# Patient Record
Sex: Male | Born: 2000 | Race: Black or African American | Hispanic: No | Marital: Single | Smoking: Never smoker
Health system: Southern US, Community
[De-identification: ages and names within clinical notes are randomized; demographics above are authoritative.]

## PROBLEM LIST (undated history)

## (undated) DIAGNOSIS — F909 Attention-deficit hyperactivity disorder, unspecified type: Secondary | ICD-10-CM

## (undated) DIAGNOSIS — J302 Other seasonal allergic rhinitis: Secondary | ICD-10-CM

## (undated) DIAGNOSIS — Z789 Other specified health status: Secondary | ICD-10-CM

## (undated) HISTORY — DX: Other specified health status: Z78.9

---
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
## 2018-01-15 IMAGING — DX DG HAND COMPLETE 3+V*L*
3 series · 3 of 3 positions shown · non-contrast
Comparison: None.

CLINICAL DATA: Closed hand in metal door, with left middle and ring
finger pain and bruising. Initial encounter.

EXAM:
LEFT HAND - COMPLETE 3+ VIEW

[hand pa]
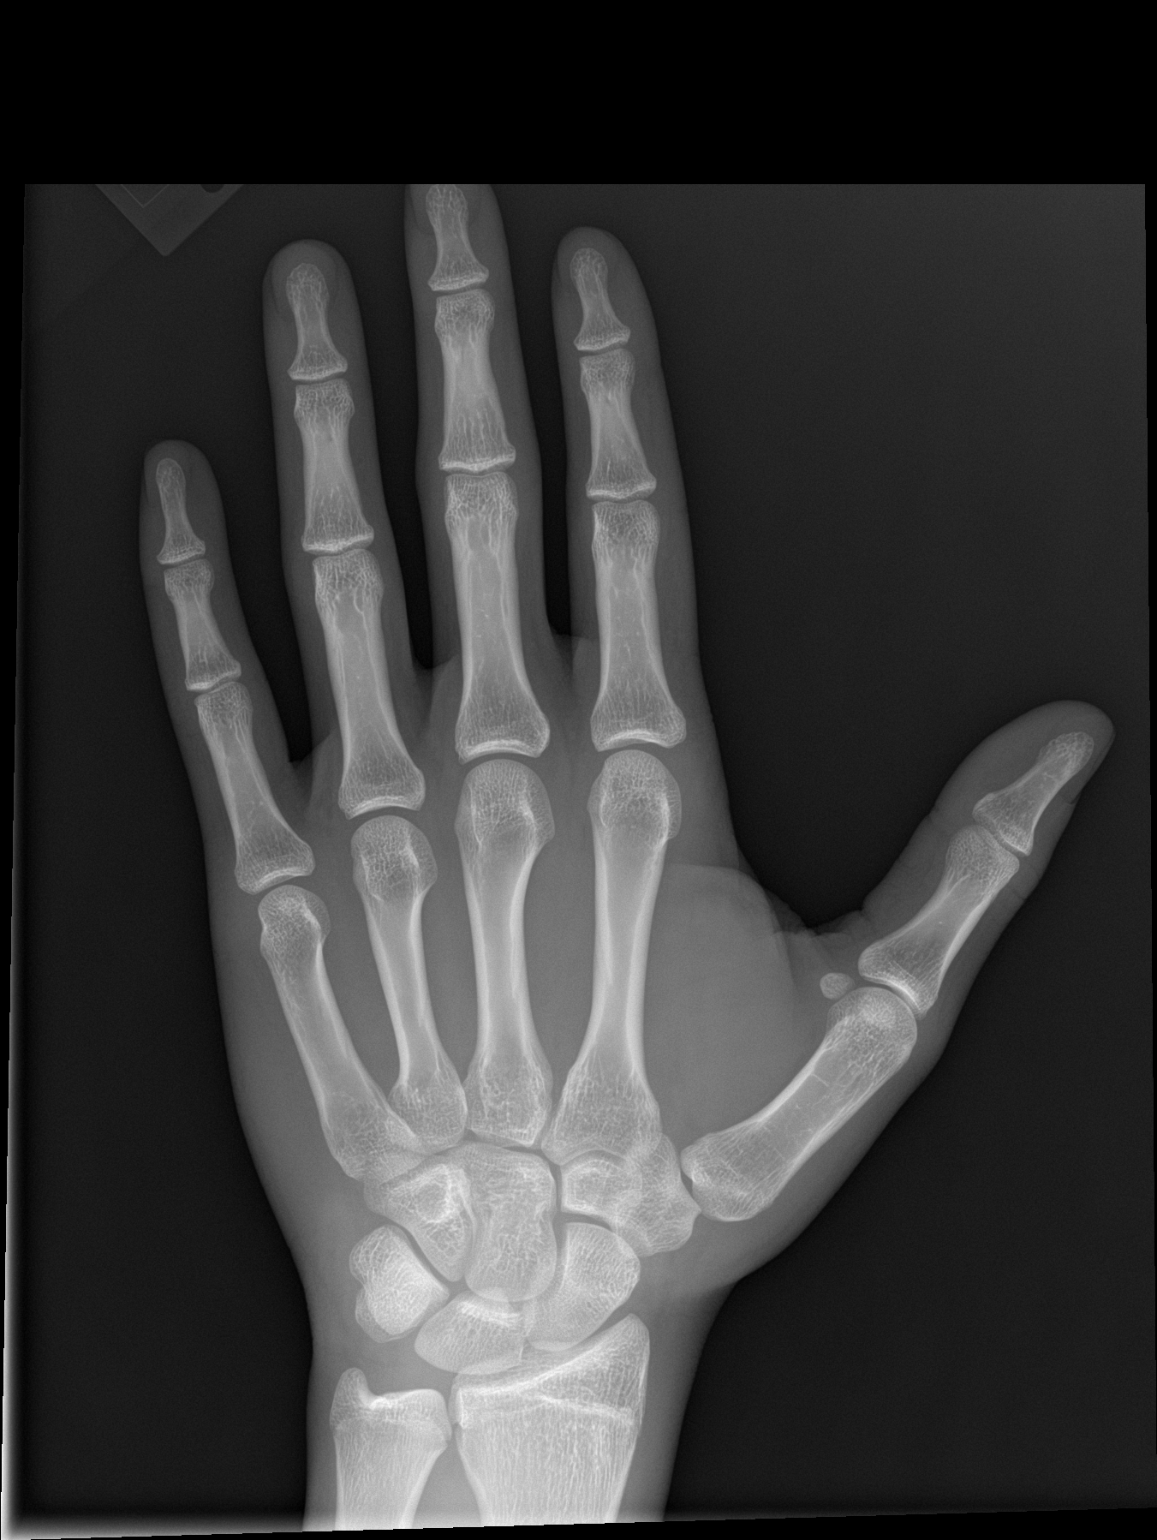

[hand obl]
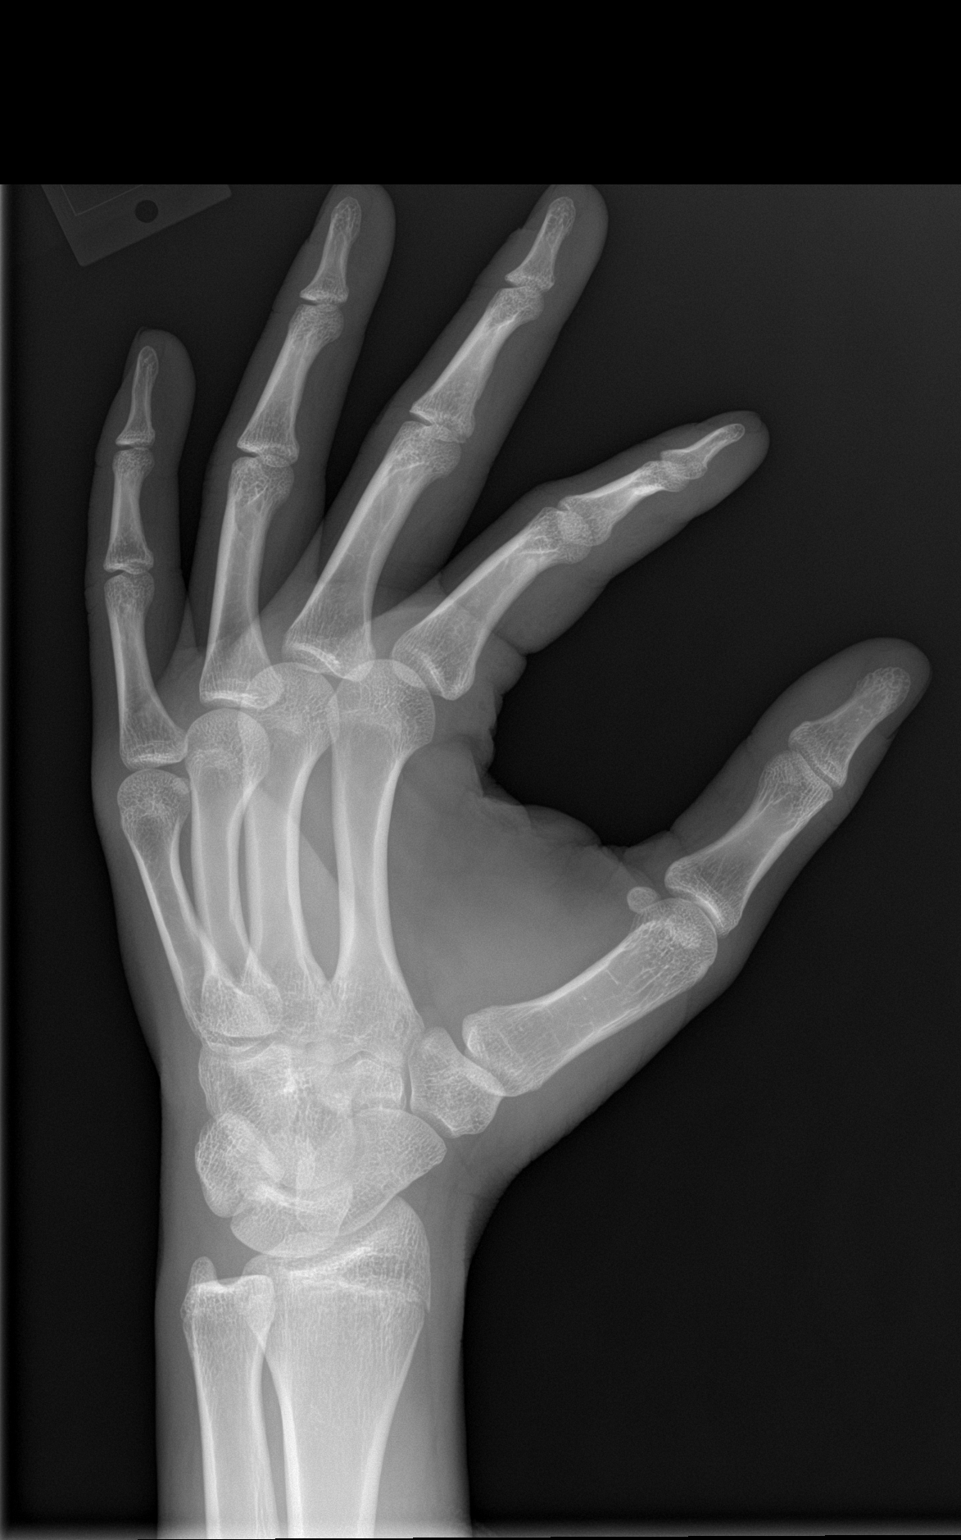

[hand lat]
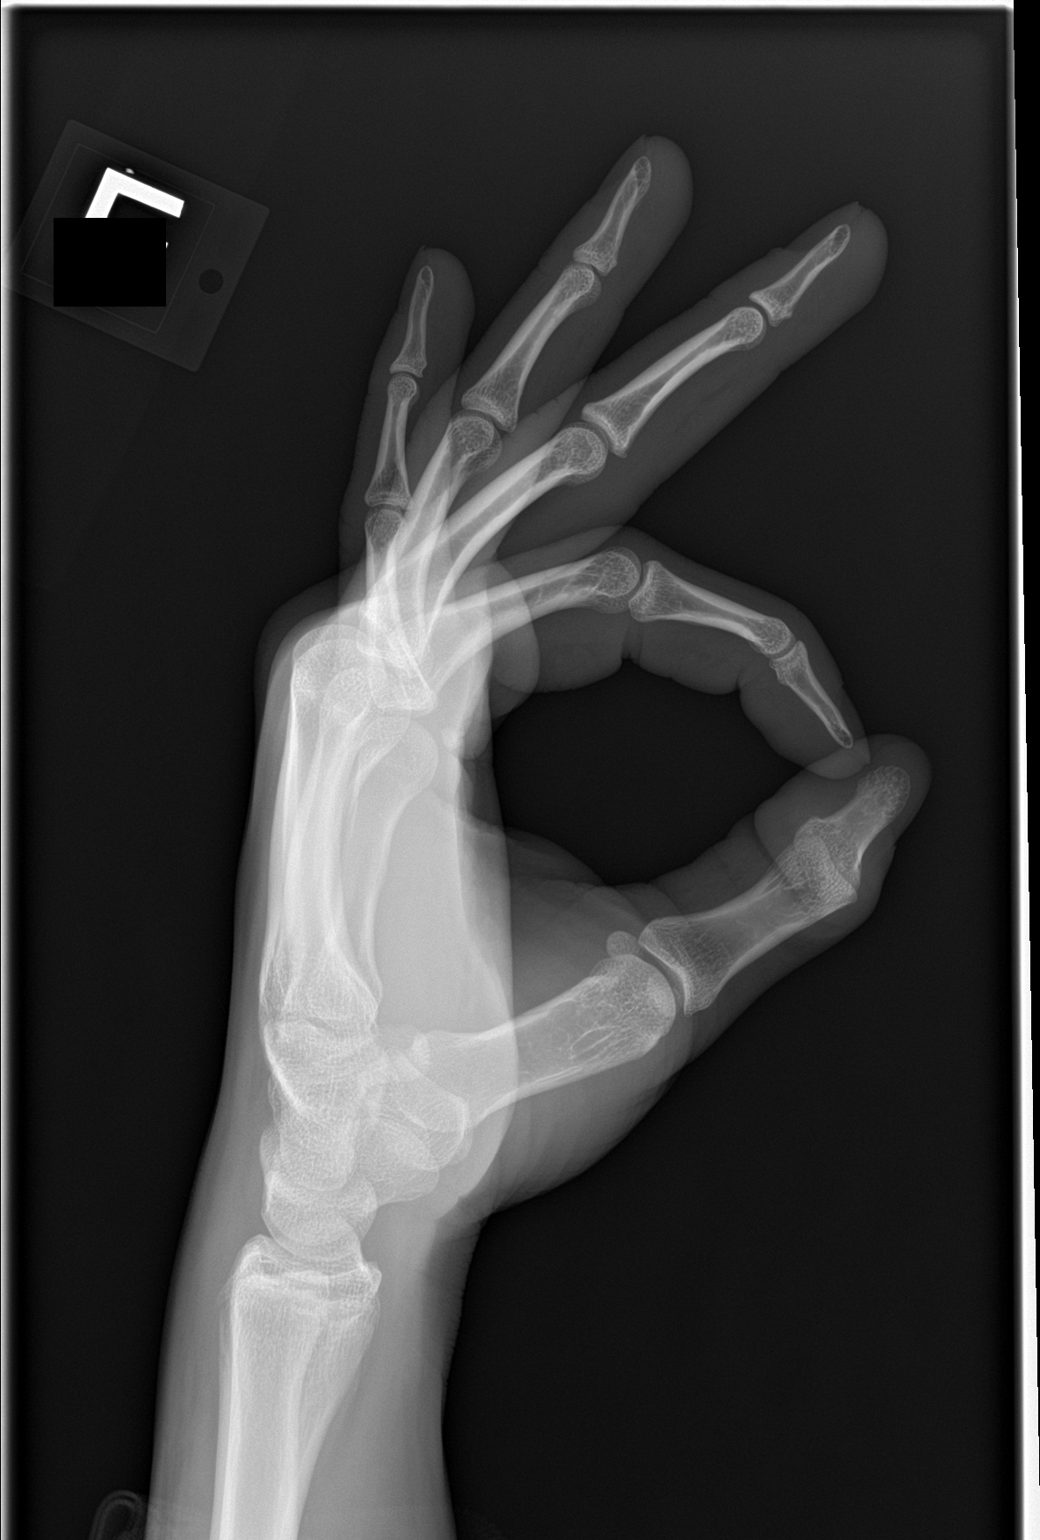

[3 of 3 positions shown; findings below may reference images not displayed]

FINDINGS: There is no evidence of fracture or dislocation. The joint spaces
are preserved. The carpal rows are intact, and demonstrate normal
alignment. Known soft tissue bruising is not well characterized on
radiograph.
IMPRESSION: No evidence of fracture or dislocation.

## 2019-02-27 DIAGNOSIS — H5213 Myopia, bilateral: Secondary | ICD-10-CM | POA: Diagnosis not present

## 2019-04-26 DIAGNOSIS — H5213 Myopia, bilateral: Secondary | ICD-10-CM | POA: Diagnosis not present

## 2019-11-26 DIAGNOSIS — H5213 Myopia, bilateral: Secondary | ICD-10-CM | POA: Diagnosis not present

## 2019-11-26 DIAGNOSIS — H1013 Acute atopic conjunctivitis, bilateral: Secondary | ICD-10-CM | POA: Diagnosis not present

## 2020-01-29 ENCOUNTER — Encounter: Payer: Self-pay | Admitting: Pediatrics
